# Patient Record
Sex: Female | Born: 1970 | Race: White | Hispanic: No | Marital: Single | Smoking: Current every day smoker
Health system: Southern US, Community
[De-identification: ages and names within clinical notes are randomized; demographics above are authoritative.]

## PROBLEM LIST (undated history)

## (undated) DIAGNOSIS — J189 Pneumonia, unspecified organism: Secondary | ICD-10-CM

## (undated) HISTORY — PX: TUBAL LIGATION: SHX77

## (undated) HISTORY — PX: CHOLECYSTECTOMY: SHX55

---

## 2002-12-17 ENCOUNTER — Emergency Department (HOSPITAL_COMMUNITY): Admission: EM | Admit: 2002-12-17 | Discharge: 2002-12-17 | Payer: Self-pay | Admitting: Emergency Medicine

## 2002-12-17 ENCOUNTER — Encounter: Payer: Self-pay | Admitting: Emergency Medicine

## 2002-12-29 ENCOUNTER — Encounter: Payer: Self-pay | Admitting: Neurosurgery

## 2002-12-29 ENCOUNTER — Ambulatory Visit (HOSPITAL_COMMUNITY): Admission: RE | Admit: 2002-12-29 | Discharge: 2002-12-29 | Payer: Self-pay | Admitting: Neurosurgery

## 2004-06-21 ENCOUNTER — Encounter (INDEPENDENT_AMBULATORY_CARE_PROVIDER_SITE_OTHER): Payer: Self-pay | Admitting: Specialist

## 2004-06-21 ENCOUNTER — Ambulatory Visit (HOSPITAL_BASED_OUTPATIENT_CLINIC_OR_DEPARTMENT_OTHER): Admission: RE | Admit: 2004-06-21 | Discharge: 2004-06-21 | Payer: Self-pay | Admitting: Otolaryngology

## 2004-06-21 ENCOUNTER — Ambulatory Visit (HOSPITAL_COMMUNITY): Admission: RE | Admit: 2004-06-21 | Discharge: 2004-06-21 | Payer: Self-pay | Admitting: Otolaryngology

## 2007-09-14 ENCOUNTER — Encounter: Admission: RE | Admit: 2007-09-14 | Discharge: 2007-09-14 | Payer: Self-pay | Admitting: Family Medicine

## 2011-01-24 NOTE — Op Note (Signed)
NAMENUHA, DEGNER               ACCOUNT NO.:  [id number]   MEDICAL RECORD NO.:  [id number]          PATIENT TYPE:  AMB   LOCATION:  DSC                          FACILITY:  [facility]   PHYSICIAN:  Christopher E. Ezzard Standing, M.D.DATE OF BIRTH:  27-Nov-1970   DATE OF PROCEDURE:  06/21/2004  DATE OF DISCHARGE:                                 OPERATIVE REPORT   PREOPERATIVE DIAGNOSIS:  Chronic tonsillitis.   POSTOPERATIVE DIAGNOSIS:  Chronic tonsillitis.   OPERATION:  Tonsillectomy.   SURGEON:  Kristine Garbe. Ezzard Standing, M.D.   ANESTHESIA:  General endotracheal anesthesia.   COMPLICATIONS:  None.   INDICATIONS FOR PROCEDURE:  Aldean Suddeth is a 40 year old female who has had  chronic problems with her tonsils with frequent sore throats as well as a  large amount of debris building up within her tonsillar crypts.  She has  tried using a Q-tip as well as waterpick to keep the tonsils clean but is  continuing to have problems with debris building up within the tonsil  crypts, sore throats and occasional bleeding when cleaning her tonsils.  She  is taken to the operating room at this time for a tonsillectomy for chronic  tonsillitis.   DESCRIPTION OF PROCEDURE:  After adequate endotracheal anesthesia, the  patient received 10 mg of Decadron IV preoperatively as well as 1 g IV  preoperatively.  A mouth gag was used to expose the oropharynx.  Bryttany had  moderate size cryptic tonsils bilaterally.  Cautery was used to excise the  tonsils from the tonsil fossae.  Care was taken to preserve the anterior and  posterior tonsillar pillars as well as the uvula.  Hemostasis was obtained  with a cautery.  After obtaining adequate hemostasis, the oropharynx was  irrigated with saline.  The nasopharynx was examined.  Siobhan had no  significant adenoid tissue.  Procedure was completed.  Larkin was awakened  from anesthesia and transferred to the recovery room postoperatively doing  well.   DISPOSITION:   Yamila is discharged home later this morning on Lortab elixir  15 to 30 mL q.4h. p.r.n. pain along with amoxicillin suspension 400 mg  b.i.d. for one week.       CEN/MEDQ  D:  06/21/2004  T:  06/21/2004  Job:  [id number]

## 2015-06-14 ENCOUNTER — Emergency Department (HOSPITAL_COMMUNITY): Payer: No Typology Code available for payment source

## 2015-06-14 ENCOUNTER — Encounter (HOSPITAL_COMMUNITY): Payer: Self-pay | Admitting: *Deleted

## 2015-06-14 ENCOUNTER — Emergency Department (HOSPITAL_COMMUNITY)
Admission: EM | Admit: 2015-06-14 | Discharge: 2015-06-14 | Disposition: A | Discharge status: Home / Self Care | Payer: No Typology Code available for payment source | Attending: Emergency Medicine | Admitting: Emergency Medicine

## 2015-06-14 DIAGNOSIS — Z720 Tobacco use: Secondary | ICD-10-CM | POA: Diagnosis not present

## 2015-06-14 DIAGNOSIS — R51 Headache: Secondary | ICD-10-CM | POA: Insufficient documentation

## 2015-06-14 DIAGNOSIS — R112 Nausea with vomiting, unspecified: Secondary | ICD-10-CM | POA: Diagnosis not present

## 2015-06-14 DIAGNOSIS — Z8701 Personal history of pneumonia (recurrent): Secondary | ICD-10-CM | POA: Insufficient documentation

## 2015-06-14 DIAGNOSIS — R42 Dizziness and giddiness: Secondary | ICD-10-CM | POA: Insufficient documentation

## 2015-06-14 HISTORY — DX: Pneumonia, unspecified organism: J18.9

## 2015-06-14 LAB — COMPREHENSIVE METABOLIC PANEL
ALK PHOS: 48 U/L (ref 38–126)
ALT: 20 U/L (ref 14–54)
ANION GAP: 10 (ref 5–15)
AST: 27 U/L (ref 15–41)
Albumin: 4.7 g/dL (ref 3.5–5.0)
BILIRUBIN TOTAL: 0.7 mg/dL (ref 0.3–1.2)
BUN: 16 mg/dL (ref 6–20)
CALCIUM: 9.8 mg/dL (ref 8.9–10.3)
CO2: 25 mmol/L (ref 22–32)
Chloride: 104 mmol/L (ref 101–111)
Creatinine, Ser: 0.58 mg/dL (ref 0.44–1.00)
GFR calc non Af Amer: >60 mL/min (ref >60)
GLUCOSE: 97 mg/dL (ref 65–99)
Potassium: 3.8 mmol/L (ref 3.5–5.1)
Sodium: 139 mmol/L (ref 135–145)
TOTAL PROTEIN: 7.7 g/dL (ref 6.5–8.1)

## 2015-06-14 LAB — CBC
HCT: 43.2 % (ref 36.0–46.0)
HEMOGLOBIN: 14.5 g/dL (ref 12.0–15.0)
MCH: 31.2 pg (ref 26.0–34.0)
MCHC: 33.6 g/dL (ref 30.0–36.0)
MCV: 92.9 fL (ref 78.0–100.0)
Platelets: 269 10*3/uL (ref 150–400)
RBC: 4.65 MIL/uL (ref 3.87–5.11)
RDW: 14.5 % (ref 11.5–15.5)
WBC: 8.8 10*3/uL (ref 4.0–10.5)

## 2015-06-14 LAB — URINALYSIS, ROUTINE W REFLEX MICROSCOPIC
BILIRUBIN URINE: NEGATIVE
Glucose, UA: NEGATIVE mg/dL
Hgb urine dipstick: NEGATIVE
Ketones, ur: 15 mg/dL — AB
Leukocytes, UA: NEGATIVE
NITRITE: NEGATIVE
PROTEIN: NEGATIVE mg/dL
SPECIFIC GRAVITY, URINE: 1.013 (ref 1.005–1.030)
UROBILINOGEN UA: 1 mg/dL (ref 0.0–1.0)
pH: 8 (ref 5.0–8.0)

## 2015-06-14 LAB — RAPID URINE DRUG SCREEN, HOSP PERFORMED
Amphetamines: NOT DETECTED
Barbiturates: NOT DETECTED
Benzodiazepines: NOT DETECTED
Cocaine: NOT DETECTED
OPIATES: NOT DETECTED
TETRAHYDROCANNABINOL: NOT DETECTED

## 2015-06-14 LAB — CBG MONITORING, ED: Glucose-Capillary: 96 mg/dL (ref 65–99)

## 2015-06-14 LAB — DIFFERENTIAL
Basophils Absolute: 0.1 10*3/uL (ref 0.0–0.1)
Basophils Relative: 1 %
Eosinophils Absolute: 0.1 10*3/uL (ref 0.0–0.7)
Eosinophils Relative: 1 %
Lymphocytes Relative: 23 %
Lymphs Abs: 2 10*3/uL (ref 0.7–4.0)
Monocytes Absolute: 0.7 10*3/uL (ref 0.1–1.0)
Monocytes Relative: 8 %
Neutro Abs: 5.9 10*3/uL (ref 1.7–7.7)
Neutrophils Relative %: 67 %

## 2015-06-14 LAB — I-STAT CHEM 8, ED
BUN: 16 mg/dL (ref 6–20)
CALCIUM ION: 1.19 mmol/L (ref 1.12–1.23)
CREATININE: 0.6 mg/dL (ref 0.44–1.00)
Chloride: 104 mmol/L (ref 101–111)
Glucose, Bld: 91 mg/dL (ref 65–99)
HCT: 47 % — ABNORMAL HIGH (ref 36.0–46.0)
Hemoglobin: 16 g/dL — ABNORMAL HIGH (ref 12.0–15.0)
Potassium: 3.7 mmol/L (ref 3.5–5.1)
SODIUM: 139 mmol/L (ref 135–145)
TCO2: 24 mmol/L (ref 0–100)

## 2015-06-14 LAB — I-STAT TROPONIN, ED: Troponin i, poc: 0 ng/mL (ref 0.00–0.08)

## 2015-06-14 LAB — APTT: aPTT: 25 s (ref 24–37)

## 2015-06-14 LAB — PROTIME-INR
INR: 0.97 (ref 0.00–1.49)
Prothrombin Time: 13.1 seconds (ref 11.6–15.2)

## 2015-06-14 LAB — ETHANOL: Alcohol, Ethyl (B): <5 mg/dL

## 2015-06-14 MED ORDER — DIAZEPAM 5 MG/ML IJ SOLN
5.0000 mg | Freq: Once | INTRAMUSCULAR | Status: AC
  Administered 2015-06-14: 5 mg via INTRAVENOUS
  Filled 2015-06-14: qty 2

## 2015-06-14 MED ORDER — DIAZEPAM 5 MG PO TABS: 5.0000 mg | ORAL_TABLET | Freq: Four times a day (QID) | ORAL | Status: DC | PRN

## 2015-06-14 MED ORDER — MECLIZINE HCL 25 MG PO TABS
25.0000 mg | ORAL_TABLET | Freq: Once | ORAL | Status: AC
  Administered 2015-06-14: 25 mg via ORAL
  Filled 2015-06-14: qty 1

## 2015-06-14 MED ORDER — ONDANSETRON HCL 4 MG/2ML IJ SOLN
4.0000 mg | Freq: Once | INTRAMUSCULAR | Status: AC
  Administered 2015-06-14: 4 mg via INTRAVENOUS
  Filled 2015-06-14: qty 2

## 2015-06-14 MED ORDER — ONDANSETRON 4 MG PO TBDP: ORAL_TABLET | ORAL | Status: AC

## 2015-06-14 MED ORDER — SODIUM CHLORIDE 0.9 % IV BOLUS (SEPSIS)
1000.0000 mL | Freq: Once | INTRAVENOUS | Status: AC
  Administered 2015-06-14: 1000 mL via INTRAVENOUS

## 2015-06-14 NOTE — ED Notes (Signed)
Pt states that she began having mild dizziness yesterday afternoon; pt states that the dizziness has gotten worse to where she is vomiting; pt states that her "eyes feel like they are moving with the dizziness"; pt also c/o headache; pt denies injury; pt states that she feels like the room is spinning

## 2015-06-14 NOTE — ED Provider Notes (Signed)
CSN: [id number]     Arrival date & time 06/14/15  4098 History   First MD Initiated Contact with Patient 06/14/15 0700     Chief Complaint  Patient presents with  . Dizziness  . Emesis     (Consider location/radiation/quality/duration/timing/severity/associated sxs/prior Treatment) HPI  44 year old female presents with acute dizziness that started yesterday around 4:30 PM. It was mild at initial onset but has progressively worsened. She was able to sleep last night but anytime she rolled over in bed she felt acute dizziness. Any sudden type of movement such as bending, twisting, or standing up causes the dizziness. If she sits vertically still her dizziness feels much better. Looking up is particularly bad. The dizziness is "violent" and feels like a room spinning sensation. There is no blurry vision but it feels like her eyes are moving with the dizziness. She has had a gradually worsening headache since waking up this morning. Denies any focal weakness or numbness. Has never had issues with vertigo before. Denies any chest pain or shortness of breath.  Past Medical History  Diagnosis Date  . Pneumonia    Past Surgical History  Procedure Laterality Date  . Tubal ligation    . Cholecystectomy     No family history on file. Social History  Substance Use Topics  . Smoking status: Current Every Day Smoker -- 1.00 packs/day    Types: Cigarettes  . Smokeless tobacco: None  . Alcohol Use: Yes     Comment: occ   OB History    No data available     Review of Systems  Constitutional: Negative for fever.  HENT: Negative for tinnitus.   Respiratory: Negative for shortness of breath.   Cardiovascular: Negative for chest pain.  Gastrointestinal: Positive for nausea and vomiting.  Neurological: Positive for dizziness and headaches. Negative for weakness and numbness.  All other systems reviewed and are negative.     Allergies  Review of patient's allergies indicates no known  allergies.  Home Medications   Prior to Admission medications   Medication Sig Start Date End Date Taking? Authorizing Provider  ibuprofen (ADVIL,MOTRIN) 200 MG tablet Take 400 mg by mouth every 6 (six) hours as needed for moderate pain.   Yes Historical Provider, MD   BP 147/87 mmHg  Pulse 61  Temp(Src) 98 F (36.7 C) (Oral)  Resp 20  Ht  (1.626 m)  Wt 180 lb (81.647 kg)  BMI 30.88 kg/m2  SpO2 99%  LMP 06/04/2015 Physical Exam  Constitutional: She is oriented to person, place, and time. She appears well-developed and well-nourished.  HENT:  Head: Normocephalic and atraumatic.  Right Ear: External ear normal.  Left Ear: External ear normal.  Nose: Nose normal.  Eyes: EOM are normal. Right eye exhibits no discharge. Left eye exhibits no discharge.  Anisocoria (L>R sized). Both are reactive to light. Normal for patient. +Nystagmus  Neck: Neck supple.  Cardiovascular: Normal rate, regular rhythm and normal heart sounds.   No murmur heard. Pulmonary/Chest: Effort normal and breath sounds normal.  Abdominal: Soft. She exhibits no distension. There is no tenderness.  Neurological: She is alert and oriented to person, place, and time.  CN 2-12 grossly intact. 5/5 strength in all 4 extremities. Normal finger to nose, normal heel to shin  Skin: Skin is warm and dry.  Nursing note and vitals reviewed.   ED Course  Procedures (including critical care time) Labs Review Labs Reviewed  URINALYSIS, ROUTINE W REFLEX MICROSCOPIC (NOT AT [facility]) -  Abnormal; Notable for the following:    APPearance CLOUDY (*)    Ketones, ur 15 (*)    All other components within normal limits  I-STAT CHEM 8, ED - Abnormal; Notable for the following:    Hemoglobin 16.0 (*)    HCT 47.0 (*)    All other components within normal limits  ETHANOL  PROTIME-INR  APTT  CBC  DIFFERENTIAL  COMPREHENSIVE METABOLIC PANEL  URINE RAPID DRUG SCREEN, HOSP PERFORMED  I-STAT TROPOININ, ED  CBG MONITORING, ED     Imaging Review Ct Head Wo Contrast  06/14/2015   CLINICAL DATA:  Vertigo and dizziness yesterday.  EXAM: CT HEAD WITHOUT CONTRAST  TECHNIQUE: Contiguous axial images were obtained from the base of the skull through the vertex without intravenous contrast.  COMPARISON:  August 12th 2004  FINDINGS: There is no midline shift, hydrocephalus, or mass. No acute hemorrhage or acute transcortical infarct is identified. The bony calvarium is intact. The visualized sinuses are clear.  IMPRESSION: No focal acute intracranial abnormality identified.   Electronically Signed   By: Sherian Rein M.D.   On: 06/14/2015 08:34   Mr Brain Wo Contrast  06/14/2015   CLINICAL DATA:  Vertigo beginning yesterday and worsening. Vomiting. Headache. Possible stroke.  EXAM: MRI HEAD WITHOUT CONTRAST  TECHNIQUE: Multiplanar, multiecho pulse sequences of the brain and surrounding structures were obtained without intravenous contrast.  COMPARISON:  Head CT 06/14/2015  FINDINGS: There is no evidence of acute infarct, intracranial hemorrhage, mass, midline shift, or extra-axial fluid collection. There is a single punctate focus of T2 hyperintensity in the subcortical white matter of the left frontal lobe, nonspecific and likely of no clinical significance. Ventricles and sulci are normal.  Orbits are unremarkable. No significant inflammatory changes are seen in the paranasal sinuses or mastoid air cells. Major intracranial vascular flow voids are preserved.  IMPRESSION: Negative brain MRI.   Electronically Signed   By: Sebastian Ache M.D.   On: 06/14/2015 12:04   I have personally reviewed and evaluated these images and lab results as part of my medical decision-making.   EKG Interpretation   Date/Time:  Thursday June 14 2015 07:53:44 EDT Ventricular Rate:  64 PR Interval:  156 QRS Duration: 98 QT Interval:  468 QTC Calculation: 483 R Axis:   62 Text Interpretation:  Normal sinus rhythm no acute ST/T changes No old   tracing to compare Confirmed by Kileigh Ortmann  MD, Simisola Sandles ([id number]) on 06/14/2015  7:59:24 AM      MDM   Final diagnoses:  Vertigo    Patient feels better but still has some symptoms despite multiple doses of meclizine, Valium, and Zofran. Is able to delay but feels somewhat unsteady. Given difficult to control vertigo and MRI was obtained and is negative. Patient can ambulate and shows no signs of falling currently. She wants to go home and adamantly declines admission for further inpatient treatment. Seems to be peripheral based on presentation and negative workup. I discussed treatment as well as importance of follow-up. Discussed that if symptoms were to worsen or do not get better she may need to be reevaluated in the ER.    Pricilla Loveless, MD 06/14/15 [phone]

## 2015-06-14 NOTE — ED Notes (Signed)
Patient transported to MRI 

## 2015-06-26 ENCOUNTER — Encounter: Payer: Self-pay | Admitting: Neurology

## 2015-06-26 ENCOUNTER — Ambulatory Visit (INDEPENDENT_AMBULATORY_CARE_PROVIDER_SITE_OTHER): Payer: No Typology Code available for payment source | Admitting: Neurology

## 2015-06-26 VITALS — BP 120/78 | HR 80 | Ht 63.75 in | Wt 162.4 lb

## 2015-06-26 DIAGNOSIS — H57059 Tonic pupil, unspecified eye: Secondary | ICD-10-CM | POA: Insufficient documentation

## 2015-06-26 DIAGNOSIS — Z720 Tobacco use: Secondary | ICD-10-CM

## 2015-06-26 DIAGNOSIS — H8110 Benign paroxysmal vertigo, unspecified ear: Secondary | ICD-10-CM

## 2015-06-26 DIAGNOSIS — G43009 Migraine without aura, not intractable, without status migrainosus: Secondary | ICD-10-CM | POA: Diagnosis not present

## 2015-06-26 DIAGNOSIS — H57053 Tonic pupil, bilateral: Secondary | ICD-10-CM | POA: Diagnosis not present

## 2015-06-26 NOTE — Progress Notes (Signed)
NEUROLOGY CONSULTATION NOTE  Sylvia CenterCrystal Mckissack MRN: [id number] DOB: 07-01-1971  Referring provider: ED referral Primary care provider: none  Reason for consult:  vertigo  HISTORY OF PRESENT ILLNESS: Sylvia Salazar is a 44 year old right-handed female with migraines and current smoker who presents for vertigo.  History obtained by patient and ED note.  Images of brain MRI and labs reviewed.  On 06/14/15, she sat in her car and felt tired.  When she stepped out of the car, she began to feel dizzy, specifically a sense of movement but not spinning.  She went to bed.  When she turned onto her other side, she developed sudden onset severe spinning sensation.  She felt nauseous and vomited.  She also noted an aching non-throbbing pressure in the back of her head.  She fell asleep and the next morning, she still felt dizzy.  [facility] MRI of the brain was unremarkable.  CBC, CMP, UA and urine drug screen were unremarkable.  Meclizine and Valium were ineffective.  Since then, she continues to have a sense of dizziness.  It is exacerbated with looking up.  She still feels like she needs to hold onto the wall.  She has history of similar dizziness but nothing this intense or lasting this long.  Usually, it lasts less than a day.  She also has a history of migraine.  They are bi-temporal, aching pain.  They are associated with photophobia and phonophobia.  They occur infrequently.    She reports an episode in 2003 where she woke up and had numbness in the left side of her face and leg.  She was unable to move her leg.  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  She has not had any recurrent spells.  PAST MEDICAL HISTORY: Past Medical History  Diagnosis Date  . Pneumonia     PAST SURGICAL HISTORY: Past Surgical History  Procedure Laterality Date  . Tubal ligation    . Cholecystectomy      MEDICATIONS: Current Outpatient Prescriptions on  File Prior to Visit  Medication Sig Dispense Refill  . ibuprofen (ADVIL,MOTRIN) 200 MG tablet Take 400 mg by mouth every 6 (six) hours as needed for moderate pain.    Marland Kitchen. ondansetron (ZOFRAN ODT) 4 MG disintegrating tablet 4mg  ODT q4 hours prn nausea/vomit 15 tablet 0   No current facility-administered medications on file prior to visit.    ALLERGIES: No Known Allergies  FAMILY HISTORY: Family History  Problem Relation Age of Onset  . Diabetes Son   . Diabetes Mother   . Hypertension      SOCIAL HISTORY: Social History   Social History  . Marital Status: Legally Separated    Spouse Name: N/A  . Number of Children: N/A  . Years of Education: N/A   Occupational History  . Not on file.   Social History Main Topics  . Smoking status: Current Every Day Smoker -- 1.00 packs/day    Types: Cigarettes  . Smokeless tobacco: Never Used  . Alcohol Use: 0.0 oz/week    0 Standard drinks or equivalent per week     Comment: occ  . Drug Use: No  . Sexual Activity: Not on file   Other Topics Concern  . Not on file   Social History Narrative   Lives with son in a one story home.  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  Education: 2 years of college.  REVIEW OF SYSTEMS: Constitutional: No fevers, chills, or sweats, no generalized fatigue, change in appetite Eyes: No visual changes, double vision, eye pain Ear, nose and throat: No hearing loss, ear pain, nasal congestion, sore throat Cardiovascular: No chest pain, palpitations Respiratory:  No shortness of breath at rest or with exertion, wheezes GastrointestinaI: No nausea, vomiting, diarrhea, abdominal pain, fecal incontinence Genitourinary:  No dysuria, urinary retention or frequency Musculoskeletal:  No neck pain, back pain Integumentary: No rash, pruritus, skin lesions Neurological: as above Psychiatric: No depression, insomnia, anxiety Endocrine: No palpitations, fatigue, diaphoresis, mood swings, change in appetite, change in  weight, increased thirst Hematologic/Lymphatic:  No anemia, purpura, petechiae. Allergic/Immunologic: no itchy/runny eyes, nasal congestion, recent allergic reactions, rashes  PHYSICAL EXAM: Filed Vitals:   06/26/15 1221  BP: 120/78  Pulse: 80   General: No acute distress.  Patient appears well-groomed.  Head:  Normocephalic/atraumatic Eyes:  fundi unremarkable, without vessel changes, exudates, hemorrhages or papilledema. Neck: supple, no paraspinal tenderness, full range of motion Back: No paraspinal tenderness Heart: regular rate and rhythm Lungs: Clear to auscultation bilaterally. Vascular: No carotid bruits. Neurological Exam: Mental status: alert and oriented to person, place, and time, recent and remote memory intact, fund of knowledge intact, attention and concentration intact, speech fluent and not dysarthric, language intact. Cranial nerves: CN I: not tested CN II: pupils round, 4mm OD, 5mm OS and poorly reactive to light, visual fields intact, fundi unremarkable, without vessel changes, exudates, hemorrhages or papilledema. CN III, IV, VI:  full range of motion, no nystagmus, no ptosis CN V: facial sensation intact CN VII: upper and lower face symmetric CN VIII: hearing intact CN IX, X: gag intact, uvula midline CN XI: sternocleidomastoid and trapezius muscles intact CN XII: tongue midline Bulk & Tone: normal, no fasciculations. Motor:  5/5 throughout Sensation: temperature and vibration sensation intact. Deep Tendon Reflexes:  2+ throughout, toes downgoing.  Finger to nose testing:  Without dysmetria.  Heel to shin:  Without dysmetria.  Gait:  Normal station and stride.  Able to turn and tandem walk. Romberg negative.  IMPRESSION: Probable benign paroxysmal positional vertigo. She still feels dizzy.  A viral illness may be a possible trigger.  Vestibular migraine is another possibility, however it would typically not last this long Migraine without aura, stable.  I  suspect the episode of left sided numbness or weakness may possibly have been a complex migraine. 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  Refer to vestibular rehab 2.  Smoking cessation 3.  Establish care with PCP 4.  Follow up in 2 months.  Thank you for allowing me to take part in the care of this patient.  Shon Millet, DO

## 2015-06-26 NOTE — Patient Instructions (Addendum)
I think the dizziness may be inner ear issue We will refer you for vestibular rehabilitation.  Try to work on quitting smoking Establish care with a primary care provider  Follow up in 2 months.

## 2015-09-13 ENCOUNTER — Ambulatory Visit: Payer: No Typology Code available for payment source | Admitting: Neurology

## 2017-01-08 IMAGING — CT CT HEAD W/O CM
2 series · 16 of 30 positions shown, 20 images · non-contrast
Comparison: [REDACTED] [id number]

CLINICAL DATA: Vertigo and dizziness yesterday.

EXAM:
CT HEAD WITHOUT CONTRAST
TECHNIQUE: Contiguous axial images were obtained from the base of the skull
through the vertex without intravenous contrast.

[Series 2: head w/o · axial · non-contrast · 0.45mm/px · z∈[+1642,+1762]mm · 13 of 28 slices shown, 17 images]
[im 2/28  brain]
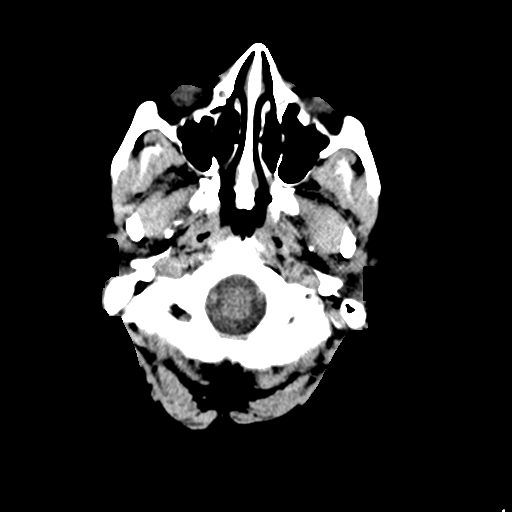
[im 2/28  bone]
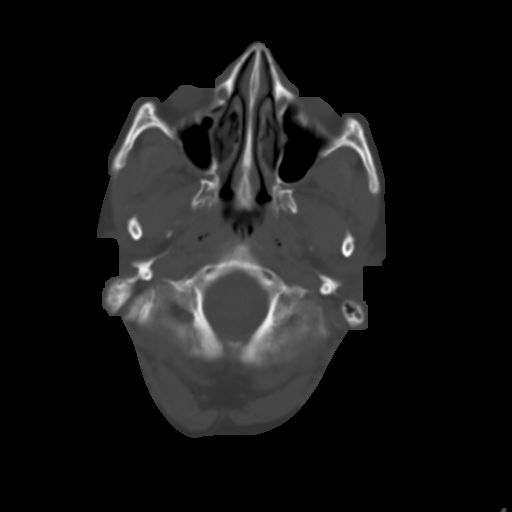
[im 4/28  brain]
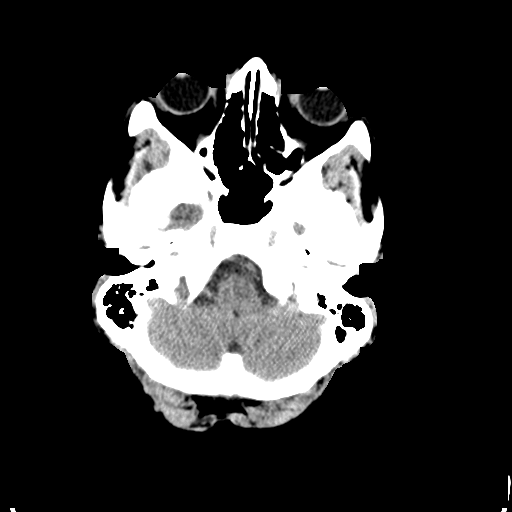
[im 6/28  brain]
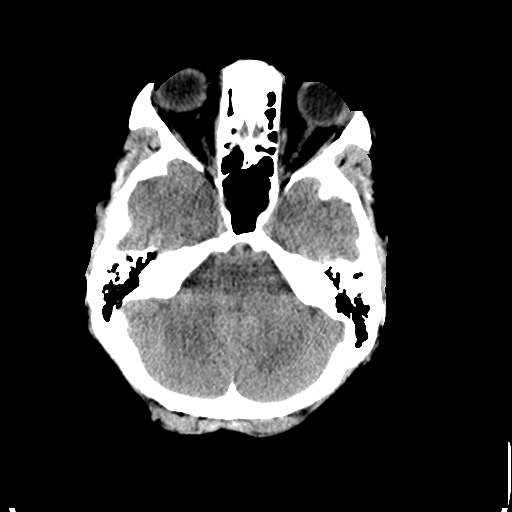
[im 8/28  brain]
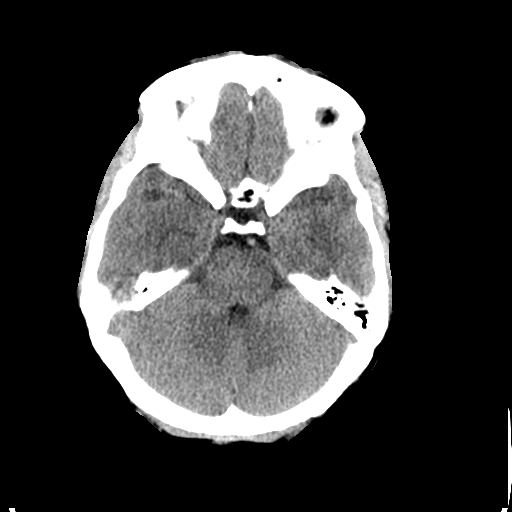
[im 10/28  brain]
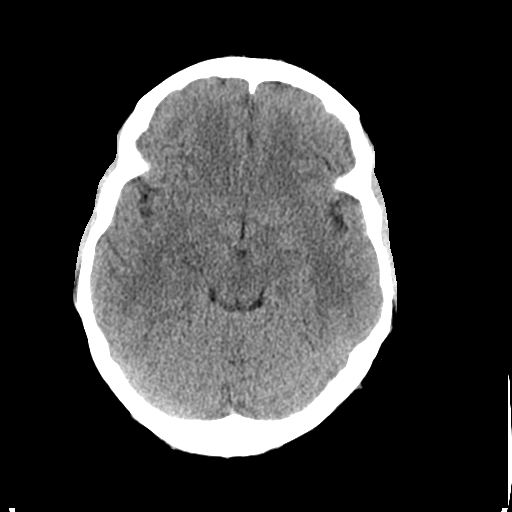
[im 10/28  bone]
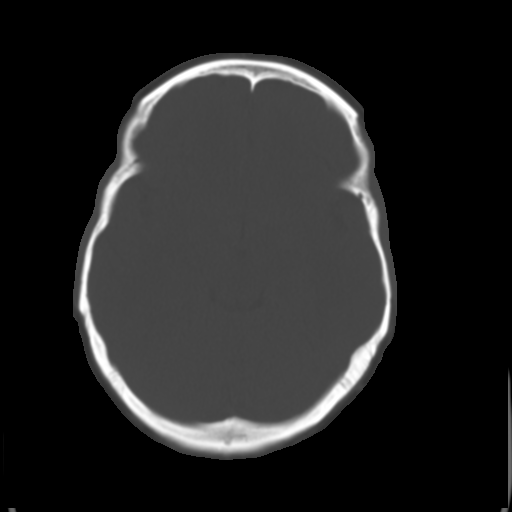
[im 12/28  brain]
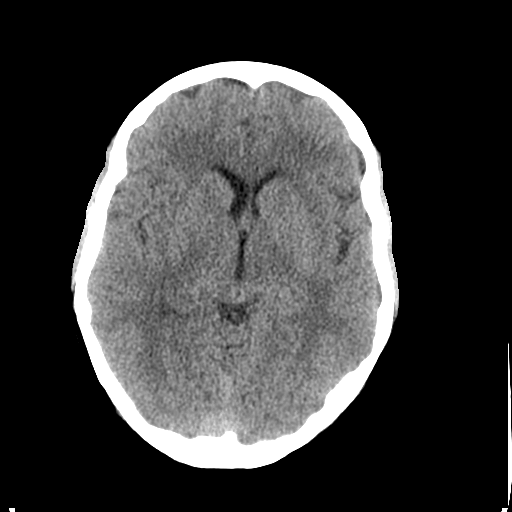
[im 14/28  brain]
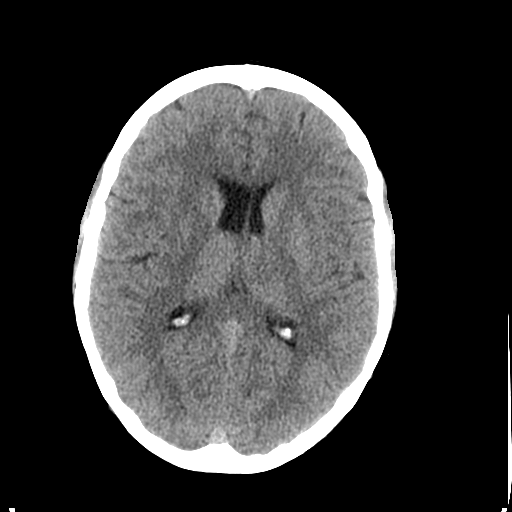
[im 16/28  brain]
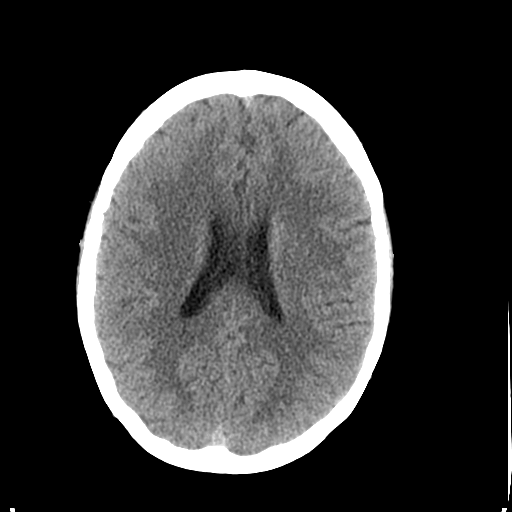
[im 18/28  brain]
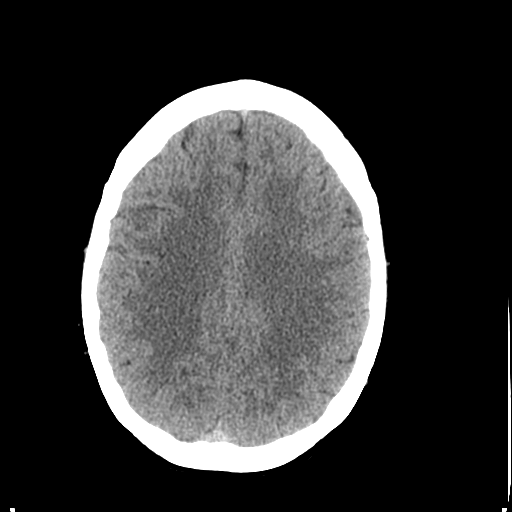
[im 18/28  bone]
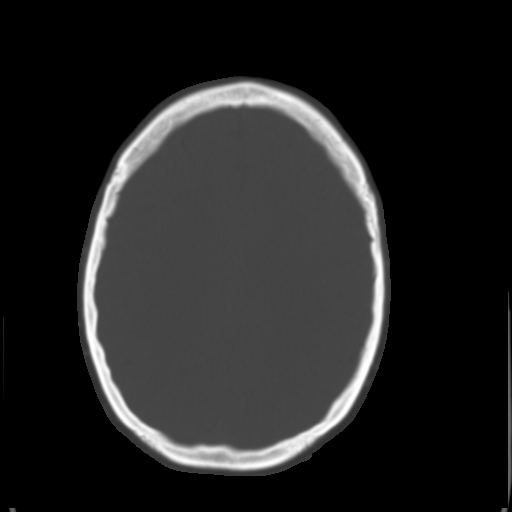
[im 20/28  brain]
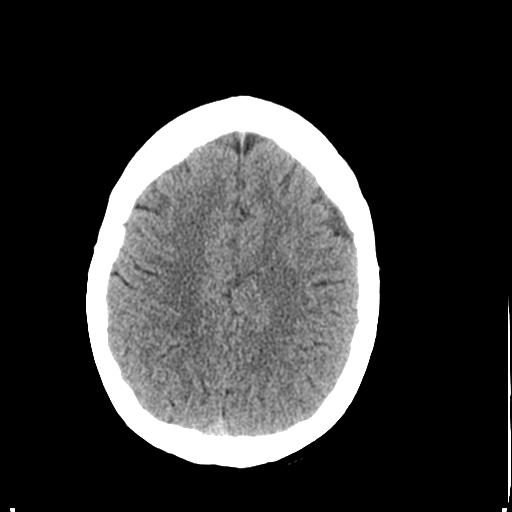
[im 22/28  brain]
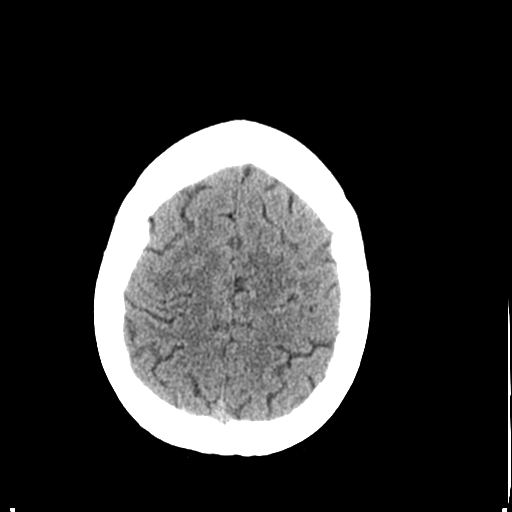
[im 24/28  brain]
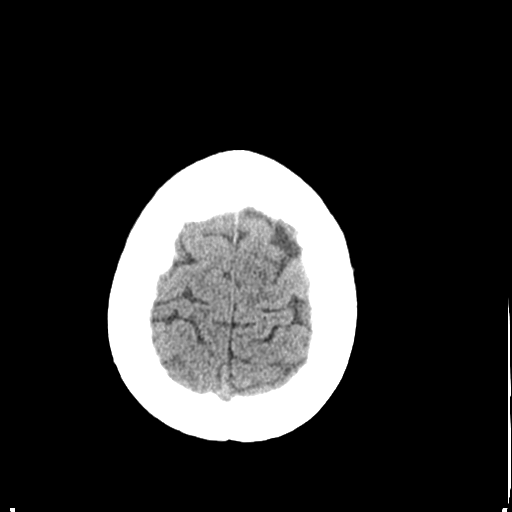
[im 26/28  brain]
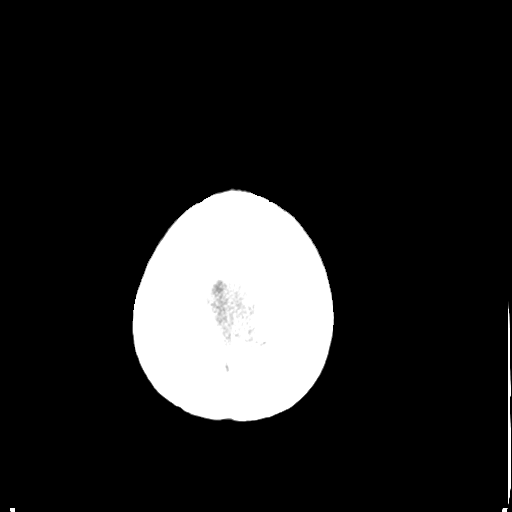
[im 26/28  bone]
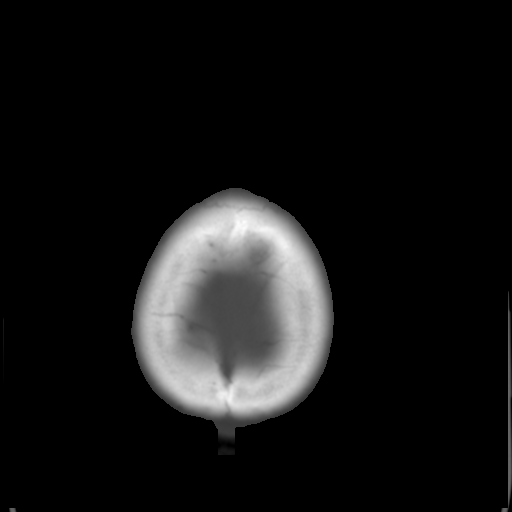

[Series 3: bone windows · axial · 0.45mm/px · z∈[+1642,+1682]mm · 3 of 28 slices shown]
[im 2/28  bone]
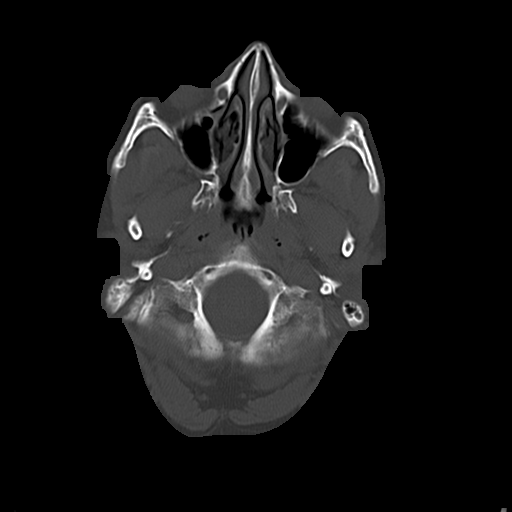
[im 6/28  bone]
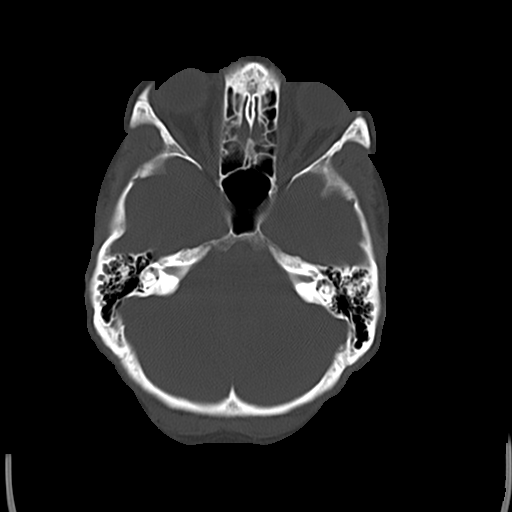
[im 10/28  bone]
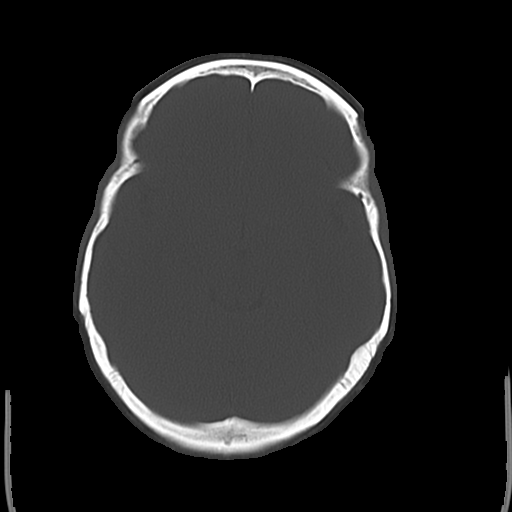

[16 of 30 positions shown; findings below may reference images not displayed]

FINDINGS: There is no midline shift, hydrocephalus, or mass. No acute
hemorrhage or acute transcortical infarct is identified. The bony
calvarium is intact. The visualized sinuses are clear.
IMPRESSION: No focal acute intracranial abnormality identified.

## 2017-06-02 ENCOUNTER — Other Ambulatory Visit: Payer: Self-pay | Admitting: Obstetrics and Gynecology

## 2017-06-02 DIAGNOSIS — R928 Other abnormal and inconclusive findings on diagnostic imaging of breast: Secondary | ICD-10-CM

## 2017-06-05 ENCOUNTER — Inpatient Hospital Stay: Admission: RE | Admit: 2017-06-05 | Payer: No Typology Code available for payment source | Source: Ambulatory Visit

## 2017-06-23 ENCOUNTER — Ambulatory Visit
Admission: RE | Admit: 2017-06-23 | Discharge: 2017-06-23 | Disposition: A | Discharge status: Home / Self Care | Payer: BLUE CROSS/BLUE SHIELD | Source: Ambulatory Visit | Attending: Obstetrics and Gynecology | Admitting: Obstetrics and Gynecology

## 2017-06-23 ENCOUNTER — Ambulatory Visit: Admission: RE | Admit: 2017-06-23 | Payer: No Typology Code available for payment source | Source: Ambulatory Visit

## 2017-06-23 DIAGNOSIS — R928 Other abnormal and inconclusive findings on diagnostic imaging of breast: Secondary | ICD-10-CM
